# Patient Record
Sex: Female | Born: 1935
Health system: Southern US, Community
[De-identification: ages and names within clinical notes are randomized; demographics above are authoritative.]

## PROBLEM LIST (undated history)

## (undated) DIAGNOSIS — E782 Mixed hyperlipidemia: Secondary | ICD-10-CM

## (undated) DIAGNOSIS — I700 Atherosclerosis of aorta: Secondary | ICD-10-CM

## (undated) DIAGNOSIS — M810 Age-related osteoporosis without current pathological fracture: Secondary | ICD-10-CM

## (undated) DIAGNOSIS — K219 Gastro-esophageal reflux disease without esophagitis: Secondary | ICD-10-CM

## (undated) HISTORY — DX: Atherosclerosis of aorta: I70.0

## (undated) HISTORY — DX: Mixed hyperlipidemia: E78.2

## (undated) HISTORY — DX: Age-related osteoporosis without current pathological fracture: M81.0

## (undated) HISTORY — DX: Gastro-esophageal reflux disease without esophagitis: K21.9

---

## 2016-09-06 DIAGNOSIS — J209 Acute bronchitis, unspecified: Secondary | ICD-10-CM | POA: Diagnosis not present

## 2016-09-12 DIAGNOSIS — R05 Cough: Secondary | ICD-10-CM | POA: Diagnosis not present

## 2016-09-15 DIAGNOSIS — Z1389 Encounter for screening for other disorder: Secondary | ICD-10-CM | POA: Diagnosis not present

## 2016-09-15 DIAGNOSIS — R0789 Other chest pain: Secondary | ICD-10-CM | POA: Diagnosis not present

## 2016-09-15 DIAGNOSIS — Z9181 History of falling: Secondary | ICD-10-CM | POA: Diagnosis not present

## 2016-09-15 DIAGNOSIS — Z0000 Encounter for general adult medical examination without abnormal findings: Secondary | ICD-10-CM | POA: Diagnosis not present

## 2016-09-15 DIAGNOSIS — J40 Bronchitis, not specified as acute or chronic: Secondary | ICD-10-CM | POA: Diagnosis not present

## 2016-09-15 DIAGNOSIS — K219 Gastro-esophageal reflux disease without esophagitis: Secondary | ICD-10-CM | POA: Diagnosis not present

## 2016-09-16 DIAGNOSIS — R072 Precordial pain: Secondary | ICD-10-CM | POA: Diagnosis not present

## 2016-09-16 DIAGNOSIS — R079 Chest pain, unspecified: Secondary | ICD-10-CM | POA: Diagnosis not present

## 2016-09-19 DIAGNOSIS — R079 Chest pain, unspecified: Secondary | ICD-10-CM | POA: Insufficient documentation

## 2016-09-19 HISTORY — DX: Chest pain, unspecified: R07.9

## 2016-09-20 DIAGNOSIS — R079 Chest pain, unspecified: Secondary | ICD-10-CM | POA: Diagnosis not present

## 2016-10-03 DIAGNOSIS — R1011 Right upper quadrant pain: Secondary | ICD-10-CM | POA: Diagnosis not present

## 2016-10-03 DIAGNOSIS — R079 Chest pain, unspecified: Secondary | ICD-10-CM | POA: Diagnosis not present

## 2016-10-03 DIAGNOSIS — J479 Bronchiectasis, uncomplicated: Secondary | ICD-10-CM | POA: Diagnosis not present

## 2016-10-03 DIAGNOSIS — K8020 Calculus of gallbladder without cholecystitis without obstruction: Secondary | ICD-10-CM | POA: Diagnosis not present

## 2017-06-12 DIAGNOSIS — M5090 Cervical disc disorder, unspecified, unspecified cervical region: Secondary | ICD-10-CM | POA: Diagnosis not present

## 2017-07-05 DIAGNOSIS — Z23 Encounter for immunization: Secondary | ICD-10-CM | POA: Diagnosis not present

## 2017-08-01 DIAGNOSIS — H353131 Nonexudative age-related macular degeneration, bilateral, early dry stage: Secondary | ICD-10-CM | POA: Diagnosis not present

## 2017-08-01 DIAGNOSIS — H5202 Hypermetropia, left eye: Secondary | ICD-10-CM | POA: Diagnosis not present

## 2017-08-01 DIAGNOSIS — H40053 Ocular hypertension, bilateral: Secondary | ICD-10-CM | POA: Diagnosis not present

## 2017-09-22 DIAGNOSIS — M7062 Trochanteric bursitis, left hip: Secondary | ICD-10-CM | POA: Diagnosis not present

## 2017-09-22 DIAGNOSIS — M75100 Unspecified rotator cuff tear or rupture of unspecified shoulder, not specified as traumatic: Secondary | ICD-10-CM | POA: Diagnosis not present

## 2017-09-22 DIAGNOSIS — Z6822 Body mass index (BMI) 22.0-22.9, adult: Secondary | ICD-10-CM | POA: Diagnosis not present

## 2018-05-15 DIAGNOSIS — Z6823 Body mass index (BMI) 23.0-23.9, adult: Secondary | ICD-10-CM | POA: Diagnosis not present

## 2018-05-15 DIAGNOSIS — J320 Chronic maxillary sinusitis: Secondary | ICD-10-CM | POA: Diagnosis not present

## 2018-05-15 DIAGNOSIS — Z23 Encounter for immunization: Secondary | ICD-10-CM | POA: Diagnosis not present

## 2018-05-15 DIAGNOSIS — J310 Chronic rhinitis: Secondary | ICD-10-CM | POA: Diagnosis not present

## 2019-03-22 DIAGNOSIS — Z0000 Encounter for general adult medical examination without abnormal findings: Secondary | ICD-10-CM | POA: Diagnosis not present

## 2019-03-22 DIAGNOSIS — Z6822 Body mass index (BMI) 22.0-22.9, adult: Secondary | ICD-10-CM | POA: Diagnosis not present

## 2019-03-22 DIAGNOSIS — Z9181 History of falling: Secondary | ICD-10-CM | POA: Diagnosis not present

## 2019-03-22 DIAGNOSIS — K219 Gastro-esophageal reflux disease without esophagitis: Secondary | ICD-10-CM | POA: Diagnosis not present

## 2019-03-22 DIAGNOSIS — Z1331 Encounter for screening for depression: Secondary | ICD-10-CM | POA: Diagnosis not present

## 2019-03-22 DIAGNOSIS — M810 Age-related osteoporosis without current pathological fracture: Secondary | ICD-10-CM | POA: Diagnosis not present

## 2019-03-22 DIAGNOSIS — Z13820 Encounter for screening for osteoporosis: Secondary | ICD-10-CM | POA: Diagnosis not present

## 2019-03-22 DIAGNOSIS — Z79899 Other long term (current) drug therapy: Secondary | ICD-10-CM | POA: Diagnosis not present

## 2019-03-22 DIAGNOSIS — E782 Mixed hyperlipidemia: Secondary | ICD-10-CM | POA: Diagnosis not present

## 2019-05-20 DIAGNOSIS — Z6821 Body mass index (BMI) 21.0-21.9, adult: Secondary | ICD-10-CM | POA: Diagnosis not present

## 2019-05-20 DIAGNOSIS — H6983 Other specified disorders of Eustachian tube, bilateral: Secondary | ICD-10-CM | POA: Diagnosis not present

## 2019-05-20 DIAGNOSIS — J0100 Acute maxillary sinusitis, unspecified: Secondary | ICD-10-CM | POA: Diagnosis not present

## 2019-06-03 DIAGNOSIS — J0100 Acute maxillary sinusitis, unspecified: Secondary | ICD-10-CM | POA: Diagnosis not present

## 2019-06-03 DIAGNOSIS — H6591 Unspecified nonsuppurative otitis media, right ear: Secondary | ICD-10-CM | POA: Diagnosis not present

## 2019-06-03 DIAGNOSIS — J309 Allergic rhinitis, unspecified: Secondary | ICD-10-CM | POA: Diagnosis not present

## 2019-06-03 DIAGNOSIS — R0982 Postnasal drip: Secondary | ICD-10-CM | POA: Diagnosis not present

## 2019-06-03 DIAGNOSIS — Z6821 Body mass index (BMI) 21.0-21.9, adult: Secondary | ICD-10-CM | POA: Diagnosis not present

## 2019-06-14 DIAGNOSIS — H26493 Other secondary cataract, bilateral: Secondary | ICD-10-CM | POA: Diagnosis not present

## 2019-06-24 DIAGNOSIS — Z6820 Body mass index (BMI) 20.0-20.9, adult: Secondary | ICD-10-CM | POA: Diagnosis not present

## 2019-06-24 DIAGNOSIS — H6591 Unspecified nonsuppurative otitis media, right ear: Secondary | ICD-10-CM | POA: Diagnosis not present

## 2019-06-24 DIAGNOSIS — Z23 Encounter for immunization: Secondary | ICD-10-CM | POA: Diagnosis not present

## 2019-08-23 DIAGNOSIS — H26493 Other secondary cataract, bilateral: Secondary | ICD-10-CM | POA: Diagnosis not present

## 2020-01-09 DIAGNOSIS — C44729 Squamous cell carcinoma of skin of left lower limb, including hip: Secondary | ICD-10-CM | POA: Diagnosis not present

## 2020-01-23 DIAGNOSIS — C44722 Squamous cell carcinoma of skin of right lower limb, including hip: Secondary | ICD-10-CM | POA: Diagnosis not present

## 2020-03-26 DIAGNOSIS — Z0000 Encounter for general adult medical examination without abnormal findings: Secondary | ICD-10-CM | POA: Diagnosis not present

## 2020-03-26 DIAGNOSIS — Z9181 History of falling: Secondary | ICD-10-CM | POA: Diagnosis not present

## 2020-03-26 DIAGNOSIS — R739 Hyperglycemia, unspecified: Secondary | ICD-10-CM | POA: Diagnosis not present

## 2020-03-26 DIAGNOSIS — M810 Age-related osteoporosis without current pathological fracture: Secondary | ICD-10-CM | POA: Diagnosis not present

## 2020-03-26 DIAGNOSIS — E782 Mixed hyperlipidemia: Secondary | ICD-10-CM | POA: Diagnosis not present

## 2020-03-26 DIAGNOSIS — Z681 Body mass index (BMI) 19 or less, adult: Secondary | ICD-10-CM | POA: Diagnosis not present

## 2020-05-22 DIAGNOSIS — L299 Pruritus, unspecified: Secondary | ICD-10-CM | POA: Diagnosis not present

## 2020-05-22 DIAGNOSIS — L821 Other seborrheic keratosis: Secondary | ICD-10-CM | POA: Diagnosis not present

## 2020-05-22 DIAGNOSIS — C44729 Squamous cell carcinoma of skin of left lower limb, including hip: Secondary | ICD-10-CM | POA: Diagnosis not present

## 2021-03-16 ENCOUNTER — Emergency Department (HOSPITAL_COMMUNITY): Payer: PPO

## 2021-03-16 ENCOUNTER — Encounter (HOSPITAL_COMMUNITY): Payer: Self-pay | Admitting: *Deleted

## 2021-03-16 ENCOUNTER — Emergency Department (HOSPITAL_COMMUNITY): Admission: EM | Admit: 2021-03-16 | Payer: Self-pay | Source: Home / Self Care

## 2021-03-16 ENCOUNTER — Emergency Department (HOSPITAL_COMMUNITY)
Admission: EM | Admit: 2021-03-16 | Discharge: 2021-03-16 | Disposition: A | Discharge status: Home / Self Care | Payer: PPO | Attending: Emergency Medicine | Admitting: Emergency Medicine

## 2021-03-16 DIAGNOSIS — R079 Chest pain, unspecified: Secondary | ICD-10-CM

## 2021-03-16 DIAGNOSIS — R072 Precordial pain: Secondary | ICD-10-CM | POA: Insufficient documentation

## 2021-03-16 DIAGNOSIS — R0789 Other chest pain: Secondary | ICD-10-CM | POA: Diagnosis not present

## 2021-03-16 DIAGNOSIS — R531 Weakness: Secondary | ICD-10-CM | POA: Diagnosis not present

## 2021-03-16 DIAGNOSIS — R1013 Epigastric pain: Secondary | ICD-10-CM | POA: Diagnosis not present

## 2021-03-16 LAB — CBC WITH DIFFERENTIAL/PLATELET
Abs Immature Granulocytes: 0.02 10*3/uL (ref 0.00–0.07)
Basophils Absolute: 0 10*3/uL (ref 0.0–0.1)
Basophils Relative: 1 %
Eosinophils Absolute: 0.1 10*3/uL (ref 0.0–0.5)
Eosinophils Relative: 1 %
HCT: 39.7 % (ref 36.0–46.0)
Hemoglobin: 13 g/dL (ref 12.0–15.0)
Immature Granulocytes: 0 %
Lymphocytes Relative: 33 %
Lymphs Abs: 2.2 10*3/uL (ref 0.7–4.0)
MCH: 29.9 pg (ref 26.0–34.0)
MCHC: 32.7 g/dL (ref 30.0–36.0)
MCV: 91.3 fL (ref 80.0–100.0)
Monocytes Absolute: 0.5 10*3/uL (ref 0.1–1.0)
Monocytes Relative: 7 %
Neutro Abs: 3.8 10*3/uL (ref 1.7–7.7)
Neutrophils Relative %: 58 %
Platelets: 261 10*3/uL (ref 150–400)
RBC: 4.35 MIL/uL (ref 3.87–5.11)
RDW: 13.8 % (ref 11.5–15.5)
WBC: 6.6 10*3/uL (ref 4.0–10.5)
nRBC: 0 % (ref 0.0–0.2)

## 2021-03-16 LAB — COMPREHENSIVE METABOLIC PANEL
ALT: 16 U/L (ref 0–44)
AST: 22 U/L (ref 15–41)
Albumin: 4 g/dL (ref 3.5–5.0)
Alkaline Phosphatase: 64 U/L (ref 38–126)
Anion gap: 7 (ref 5–15)
BUN: 9 mg/dL (ref 8–23)
CO2: 28 mmol/L (ref 22–32)
Calcium: 9.4 mg/dL (ref 8.9–10.3)
Chloride: 102 mmol/L (ref 98–111)
Creatinine, Ser: 0.75 mg/dL (ref 0.44–1.00)
GFR, Estimated: >60 mL/min (ref >60)
Glucose, Bld: 92 mg/dL (ref 70–99)
Potassium: 4 mmol/L (ref 3.5–5.1)
Sodium: 137 mmol/L (ref 135–145)
Total Bilirubin: 0.8 mg/dL (ref 0.3–1.2)
Total Protein: 6.1 g/dL — ABNORMAL LOW (ref 6.5–8.1)

## 2021-03-16 LAB — TROPONIN I (HIGH SENSITIVITY)
Troponin I (High Sensitivity): 3 ng/L (ref <18)
Troponin I (High Sensitivity): 4 ng/L (ref <18)

## 2021-03-16 NOTE — Discharge Instructions (Addendum)
Please call your family doctor and discuss with them your visit today.  Your troponins were both negative which makes it unlikely that you are having an acute heart attack but not impossible.  Please return for worsening symptoms recurrent and persistent chest pain.  Based on your age and history your family doctor may decide that you need to see a cardiologist or perhaps have a different study to evaluate your heart.

## 2021-03-16 NOTE — ED Notes (Signed)
Unable to locate pt in lobby for vital check.

## 2021-03-16 NOTE — ED Triage Notes (Signed)
To ED for eval after having episode of epigastric pain. Pt states she was working in her yard when the pain started and lasted a few minutes. EMS came to home and did an ECG. Pt brought to ED via POV. No pain now. States 'i don't feel right'. Pt tells me she took a 'bunch' of tums and ems gave her asa pta. No n/v. Appears nad.

## 2021-03-16 NOTE — ED Provider Notes (Signed)
Emergency Medicine Provider Triage Evaluation Note  BANI GIANFRANCESCO , a 87 y.o. female  was evaluated in triage.  Pt complains of chest pain that started today while she was applying Roundup on the yard, states the pain stayed around her mid chest, does not radiate, did not have any associated shortness of breath, becoming diaphoretic, nausea, vomiting, lightheaded or dizziness.  She has no some cardiac history, denies any illicit drug use.  She currently has no chest pain this time..  Review of Systems  Positive: Chest pain Negative: Shortness of breath pedal edema  Physical Exam  BP (!) 172/80   Pulse 74   Temp 98.2 F (36.8 C) (Oral)   Resp 12   SpO2 100%  Gen:   Awake, no distress   Resp:  Normal effort  MSK:   Moves extremities without difficulty  Other:    Medical Decision Making  Medically screening exam initiated at 12:26 PM.  Appropriate orders placed.  Tiana Loft was informed that the remainder of the evaluation will be completed by another provider, this initial triage assessment does not replace that evaluation, and the importance of remaining in the ED until their evaluation is complete.  Patient presents with chest pain, labwork imaging have been ordered, patient will need further work-up in the emergency department.   Marcello Fennel, PA-C 03/16/21 1227    Carmin Muskrat, MD 03/16/21 [phone]

## 2021-03-17 ENCOUNTER — Encounter (HOSPITAL_COMMUNITY): Payer: Self-pay | Admitting: *Deleted

## 2021-03-24 DIAGNOSIS — I200 Unstable angina: Secondary | ICD-10-CM | POA: Diagnosis not present

## 2021-03-24 DIAGNOSIS — Z681 Body mass index (BMI) 19 or less, adult: Secondary | ICD-10-CM | POA: Diagnosis not present

## 2021-03-24 DIAGNOSIS — I700 Atherosclerosis of aorta: Secondary | ICD-10-CM | POA: Diagnosis not present

## 2021-03-25 DIAGNOSIS — I200 Unstable angina: Secondary | ICD-10-CM | POA: Diagnosis not present

## 2021-03-25 DIAGNOSIS — R079 Chest pain, unspecified: Secondary | ICD-10-CM | POA: Diagnosis not present

## 2021-03-25 DIAGNOSIS — I700 Atherosclerosis of aorta: Secondary | ICD-10-CM | POA: Diagnosis not present

## 2021-03-31 NOTE — ED Provider Notes (Signed)
[facility] EMERGENCY DEPARTMENT Provider Note   CSN: [id number] Arrival date & time: 03/16/21  1159     History Chief Complaint  Patient presents with   Chest Pain    Jennifer Banks is a 87 y.o. female.  87 yo F with a chief complaints of chest pain.  Started while she was spraying Roundup in her yard.  Lasted for a few hours and then resolved.  Currently has no pain has not had pain for some time.  Denies any shortness of breath diaphoresis nausea or vomiting with this.  The history is provided by the patient.  Chest Pain Pain location:  Substernal area Pain quality: aching   Pain radiates to:  Does not radiate Pain severity:  Mild Onset quality:  Sudden Duration:  2 minutes Timing:  Rare Progression:  Resolved Chronicity:  New Relieved by:  Nothing Worsened by:  Nothing Ineffective treatments:  None tried Associated symptoms: no dizziness, no fever, no headache, no nausea, no palpitations, no shortness of breath and no vomiting       History reviewed. No pertinent past medical history.  There are no problems to display for this patient.   History reviewed. No pertinent surgical history.   OB History   No obstetric history on file.     No family history on file.     Home Medications Prior to Admission medications   Not on File    Allergies    Codeine, Morphine and related, and Penicillins  Review of Systems   Review of Systems  Constitutional:  Negative for chills and fever.  HENT:  Negative for congestion and rhinorrhea.   Eyes:  Negative for redness and visual disturbance.  Respiratory:  Negative for shortness of breath and wheezing.   Cardiovascular:  Positive for chest pain. Negative for palpitations.  Gastrointestinal:  Negative for nausea and vomiting.  Genitourinary:  Negative for dysuria and urgency.  Musculoskeletal:  Negative for arthralgias and myalgias.  Skin:  Negative for pallor and wound.  Neurological:   Negative for dizziness and headaches.   Physical Exam Updated Vital Signs BP 138/69   Pulse 67   Temp 97.6 F (36.4 C) (Oral)   Resp 15   SpO2 100%   Physical Exam Vitals and nursing note reviewed.  Constitutional:      General: She is not in acute distress.    Appearance: She is well-developed. She is not diaphoretic.  HENT:     Head: Normocephalic and atraumatic.  Eyes:     Pupils: Pupils are equal, round, and reactive to light.  Cardiovascular:     Rate and Rhythm: Normal rate and regular rhythm.     Heart sounds: No murmur heard.   No friction rub. No gallop.  Pulmonary:     Effort: Pulmonary effort is normal.     Breath sounds: No wheezing or rales.  Abdominal:     General: There is no distension.     Palpations: Abdomen is soft.     Tenderness: There is no abdominal tenderness.  Musculoskeletal:        General: No tenderness.     Cervical back: Normal range of motion and neck supple.  Skin:    General: Skin is warm and dry.  Neurological:     Mental Status: She is alert and oriented to person, place, and time.  Psychiatric:        Behavior: Behavior normal.    ED Results / Procedures / Treatments  Labs (all labs ordered are listed, but only abnormal results are displayed) Labs Reviewed  COMPREHENSIVE METABOLIC PANEL - Abnormal; Notable for the following components:      Result Value   Total Protein 6.1 (*)    All other components within normal limits  CBC WITH DIFFERENTIAL/PLATELET  TROPONIN I (HIGH SENSITIVITY)  TROPONIN I (HIGH SENSITIVITY)    EKG EKG Interpretation  Date/Time:  Tuesday March 16 2021 12:15:56 EDT Ventricular Rate:  77 PR Interval:  154 QRS Duration: 72 QT Interval:  382 QTC Calculation: 432 R Axis:   36 Text Interpretation: Normal sinus rhythm Nonspecific T wave abnormality Abnormal ECG No old tracing to compare Confirmed by Deno Etienne [phone]) on 03/16/2021 5:54:14 PM  Radiology No results found.  Procedures Procedures    Medications Ordered in ED Medications - No data to display  ED Course  I have reviewed the triage vital signs and the nursing notes.  Pertinent labs & imaging results that were available during my care of the patient were reviewed by me and considered in my medical decision making (see chart for details).    MDM Rules/Calculators/A&P                           87 yo F with a chief complaint of chest pain.  Nonspecific presentation EKG without concerning finding chest x-ray viewed by me without focal infiltrate.  Delta trop both negative.  Discharge home.  PCP follow-up.  11:00 PM:  I have discussed the diagnosis/risks/treatment options with the patient and believe the pt to be eligible for discharge home to follow-up with PCP. We also discussed returning to the ED immediately if new or worsening sx occur. We discussed the sx which are most concerning (e.g., sudden worsening pain, fever, inability to tolerate by mouth) that necessitate immediate return. Medications administered to the patient during their visit and any new prescriptions provided to the patient are listed below.  Medications given during this visit Medications - No data to display   The patient appears reasonably screen and/or stabilized for discharge and I doubt any other medical condition or other [facility] requiring further screening, evaluation, or treatment in the ED at this time prior to discharge.   Final Clinical Impression(s) / ED Diagnoses Final diagnoses:  Nonspecific chest pain    Rx / DC Orders ED Discharge Orders     None        Deno Etienne, DO 03/31/21 2300

## 2021-04-01 ENCOUNTER — Encounter: Payer: Self-pay | Admitting: *Deleted

## 2021-04-01 ENCOUNTER — Encounter: Payer: Self-pay | Admitting: Cardiology

## 2021-04-05 DIAGNOSIS — Z681 Body mass index (BMI) 19 or less, adult: Secondary | ICD-10-CM | POA: Diagnosis not present

## 2021-04-05 DIAGNOSIS — J449 Chronic obstructive pulmonary disease, unspecified: Secondary | ICD-10-CM | POA: Diagnosis not present

## 2021-04-05 DIAGNOSIS — Z1331 Encounter for screening for depression: Secondary | ICD-10-CM | POA: Diagnosis not present

## 2021-04-05 DIAGNOSIS — J301 Allergic rhinitis due to pollen: Secondary | ICD-10-CM | POA: Diagnosis not present

## 2021-04-05 DIAGNOSIS — Z79899 Other long term (current) drug therapy: Secondary | ICD-10-CM | POA: Diagnosis not present

## 2021-04-05 DIAGNOSIS — M810 Age-related osteoporosis without current pathological fracture: Secondary | ICD-10-CM | POA: Diagnosis not present

## 2021-04-05 DIAGNOSIS — Z0000 Encounter for general adult medical examination without abnormal findings: Secondary | ICD-10-CM | POA: Diagnosis not present

## 2021-04-05 DIAGNOSIS — I700 Atherosclerosis of aorta: Secondary | ICD-10-CM | POA: Diagnosis not present

## 2021-04-05 DIAGNOSIS — E782 Mixed hyperlipidemia: Secondary | ICD-10-CM | POA: Diagnosis not present

## 2021-04-05 DIAGNOSIS — Z9181 History of falling: Secondary | ICD-10-CM | POA: Diagnosis not present

## 2021-04-05 DIAGNOSIS — K2960 Other gastritis without bleeding: Secondary | ICD-10-CM | POA: Diagnosis not present

## 2021-04-05 DIAGNOSIS — I200 Unstable angina: Secondary | ICD-10-CM | POA: Diagnosis not present

## 2021-04-07 ENCOUNTER — Other Ambulatory Visit: Payer: Self-pay

## 2021-04-07 ENCOUNTER — Ambulatory Visit: Payer: PPO | Admitting: Student

## 2021-04-07 ENCOUNTER — Encounter: Payer: Self-pay | Admitting: Student

## 2021-04-07 VITALS — BP 122/68 | HR 78 | Ht 62.0 in | Wt 108.0 lb

## 2021-04-07 DIAGNOSIS — R0789 Other chest pain: Secondary | ICD-10-CM

## 2021-04-07 DIAGNOSIS — R052 Subacute cough: Secondary | ICD-10-CM | POA: Diagnosis not present

## 2021-04-07 DIAGNOSIS — R918 Other nonspecific abnormal finding of lung field: Secondary | ICD-10-CM

## 2021-04-07 NOTE — Patient Instructions (Signed)
-   Can look on amazon for 'flutter valve' - they are $35-40. I have also prescribed it so you can try picking it up at the pharmacy. Twice daily take 10 slow but firm breaths through it to try to cough up some junk. - I have ordered a swallowing test which our front desk will help schedule - I will see you in 4 months after your cardiology appointment - If this isn't simply related to trouble swallowing/aspiration then another consideration is a slow nontoxic infection from non-tuberculous mycobacteria. If your cough and shortness of breath haven't improved, or if you're having fever, night sweats and your heart looks ok to the cardiologist then we can discuss the role of bronchoscopy.
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
## 2021-04-08 ENCOUNTER — Other Ambulatory Visit (HOSPITAL_COMMUNITY): Payer: Self-pay

## 2021-04-08 DIAGNOSIS — R1310 Dysphagia, unspecified: Secondary | ICD-10-CM

## 2021-04-12 ENCOUNTER — Other Ambulatory Visit: Payer: Self-pay

## 2021-04-12 ENCOUNTER — Ambulatory Visit (HOSPITAL_COMMUNITY)
Admission: RE | Admit: 2021-04-12 | Discharge: 2021-04-12 | Disposition: A | Discharge status: Home / Self Care | Payer: PPO | Source: Ambulatory Visit | Attending: Student | Admitting: Student

## 2021-04-12 DIAGNOSIS — R1310 Dysphagia, unspecified: Secondary | ICD-10-CM | POA: Insufficient documentation

## 2021-04-12 DIAGNOSIS — R918 Other nonspecific abnormal finding of lung field: Secondary | ICD-10-CM

## 2021-04-12 DIAGNOSIS — R0989 Other specified symptoms and signs involving the circulatory and respiratory systems: Secondary | ICD-10-CM | POA: Diagnosis not present

## 2021-04-12 NOTE — Progress Notes (Signed)
Modified Barium Swallow Progress Note  Patient Details  Name: Jennifer Banks MRN: [id number] Date of Birth: August 17, 1933  Today's Date: 04/12/2021  Modified Barium Swallow completed.  Full report located under Chart Review in the Imaging Section.  Brief recommendations include the following:  Clinical Impression  Pt has a functional oropharyngeal swallow with no aspiration observed. She did have a few isolated instances of penetration, but this cleared spontaneously (primarily PAS #2, PAS #4 x1) and is not considered to be abnormal. Although there were no findings that could directly contribute to her coughing, SLP did review aspiration and esophageal precautions. Would defer additional w/u at this point to MD.   Swallow Evaluation Recommendations   Recommended Consults: Consider ENT evaluation;Consider esophageal assessment   SLP Diet Recommendations: Regular solids;Thin liquid   Liquid Administration via: Cup;Straw   Medication Administration: Whole meds with liquid   Supervision: Patient able to self feed   Compensations: Slow rate;Small sips/bites;Follow solids with liquid   Postural Changes: Remain semi-upright after after feeds/meals (Comment);Seated upright at 90 degrees   Oral Care Recommendations: Oral care BID        Osie Bond., M.A. [facility] Pager [phone] Office [phone]  04/12/2021,12:42 PM

## 2021-04-13 ENCOUNTER — Telehealth: Payer: Self-pay | Admitting: Student

## 2021-04-13 NOTE — Telephone Encounter (Signed)
Called to discuss results of swallow study which was normal. Will await cardiology clinic evaluation prior to next visit at which point we will consider bronchoscopy/BAL for possible NTM infection. All questions addressed.

## 2021-04-29 ENCOUNTER — Ambulatory Visit: Payer: PPO | Admitting: Cardiology

## 2021-05-03 ENCOUNTER — Ambulatory Visit: Payer: PPO | Admitting: Cardiology

## 2021-06-11 ENCOUNTER — Ambulatory Visit: Payer: PPO | Admitting: Cardiology

## 2021-07-15 DIAGNOSIS — Z23 Encounter for immunization: Secondary | ICD-10-CM | POA: Diagnosis not present

## 2021-08-11 DIAGNOSIS — Z681 Body mass index (BMI) 19 or less, adult: Secondary | ICD-10-CM | POA: Diagnosis not present

## 2021-08-11 DIAGNOSIS — N3001 Acute cystitis with hematuria: Secondary | ICD-10-CM | POA: Diagnosis not present

## 2021-08-17 DIAGNOSIS — R3129 Other microscopic hematuria: Secondary | ICD-10-CM | POA: Diagnosis not present

## 2021-08-17 DIAGNOSIS — N3946 Mixed incontinence: Secondary | ICD-10-CM | POA: Diagnosis not present

## 2021-08-17 DIAGNOSIS — N3000 Acute cystitis without hematuria: Secondary | ICD-10-CM | POA: Diagnosis not present

## 2021-08-17 DIAGNOSIS — Z681 Body mass index (BMI) 19 or less, adult: Secondary | ICD-10-CM | POA: Diagnosis not present

## 2021-08-18 IMAGING — RF DG SWALLOWING FUNCTION
12 series · 12 of 24 positions shown · non-contrast
Comparison: Chest CTA 03/25/2021

CLINICAL DATA: Dysphagia. Foreign body sensation in throat.

EXAM:
MODIFIED BARIUM SWALLOW
TECHNIQUE: Different consistencies of barium were administered orally to the
patient by the Speech Pathologist. Imaging of the pharynx was
performed in the lateral projection. The radiologist was present in
the fluoroscopy room for this study, providing personal supervision.
FLUOROSCOPY TIME:  Fluoroscopy Time:  52 seconds
Radiation Exposure Index (if provided by the fluoroscopic device):
NA
Number of Acquired Spot Images: 0

[Series 1: run · 1 of 13 frames shown (1 of 12)]
[frame 7/13]
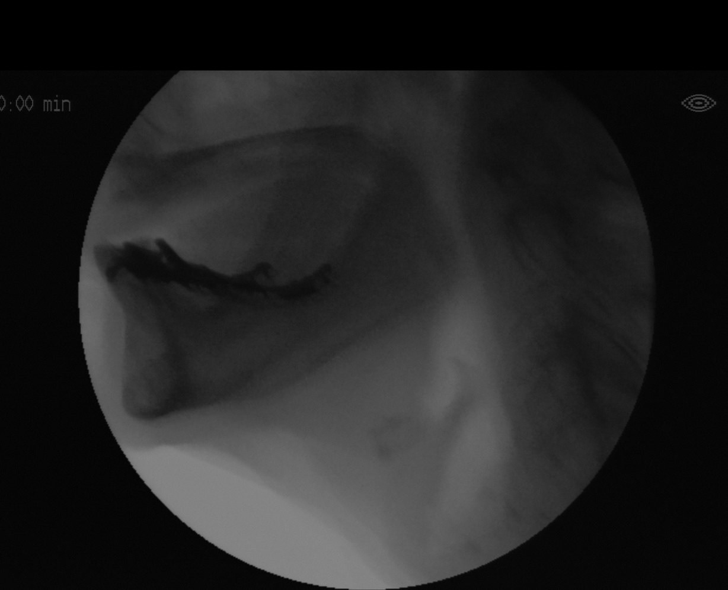

[Series 2: run · 1 of 10 frames shown (2 of 12)]
[frame 9/10]
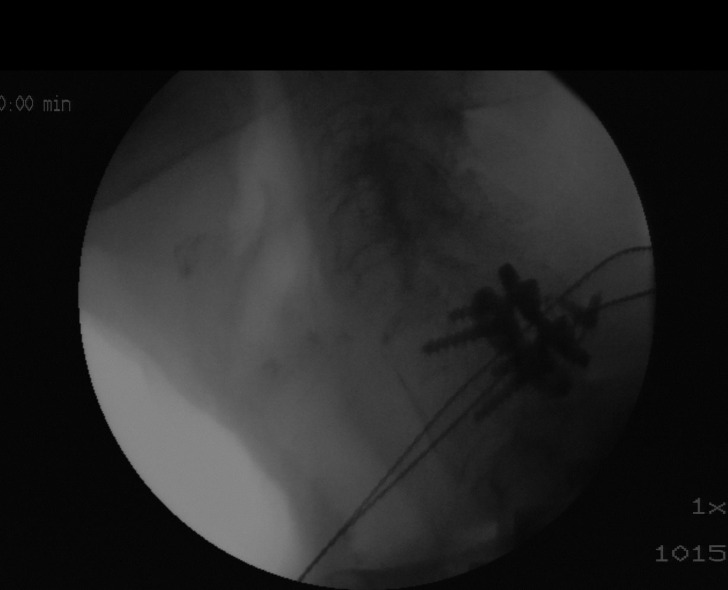

[Series 3: run · 1 of 179 frames shown (3 of 12)]
[frame 90/179]
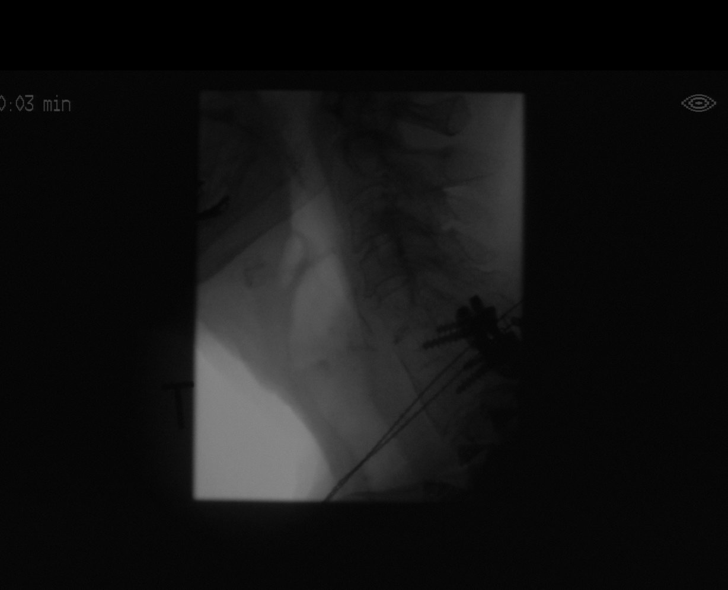

[Series 4: run · 1 of 185 frames shown (4 of 12)]
[frame 116/185]
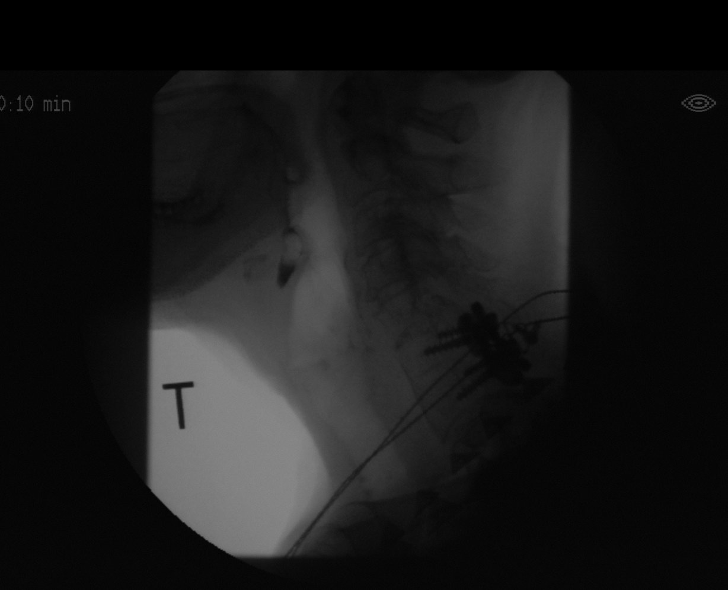

[Series 5: run · 1 of 172 frames shown (5 of 12)]
[frame 130/172]
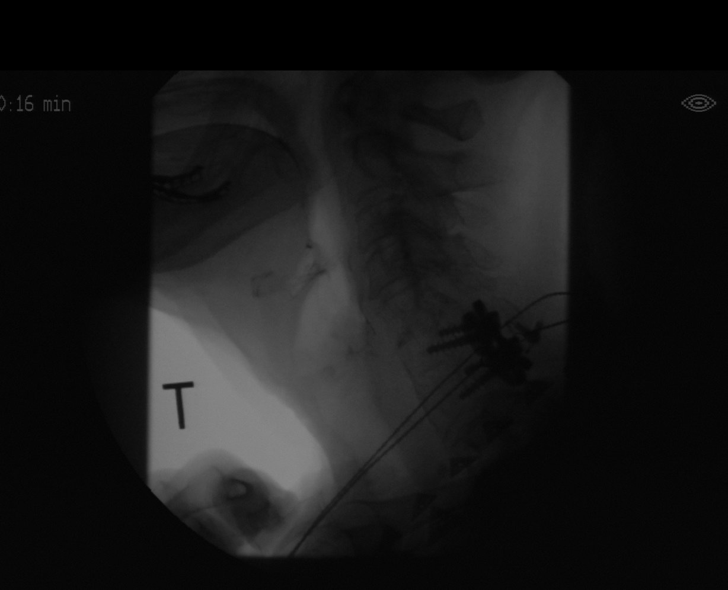

[Series 6: run · 1 of 77 frames shown (6 of 12)]
[frame 48/77]
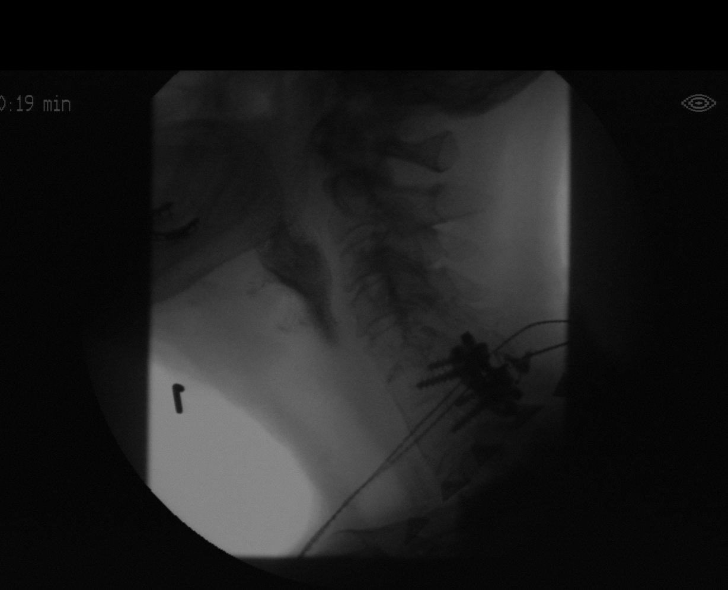

[Series 7: run · 1 of 53 frames shown (7 of 12)]
[frame 50/53]
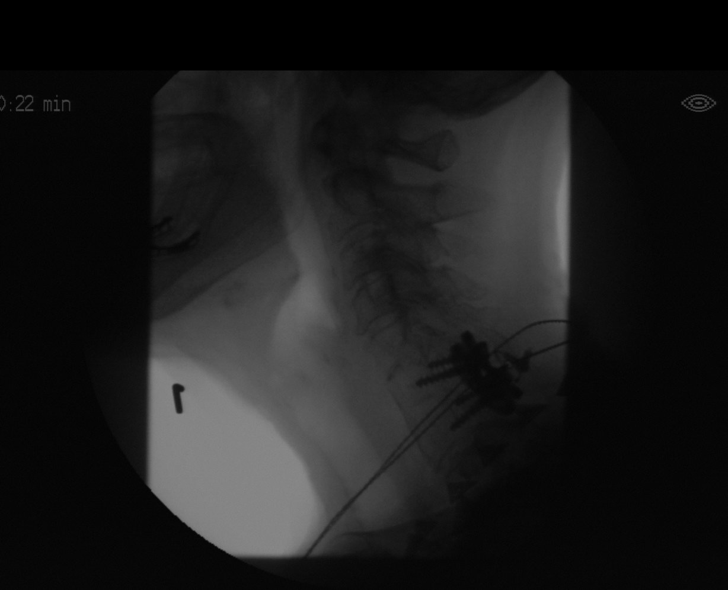

[Series 8: run · 1 of 38 frames shown (8 of 12)]
[frame 37/38]
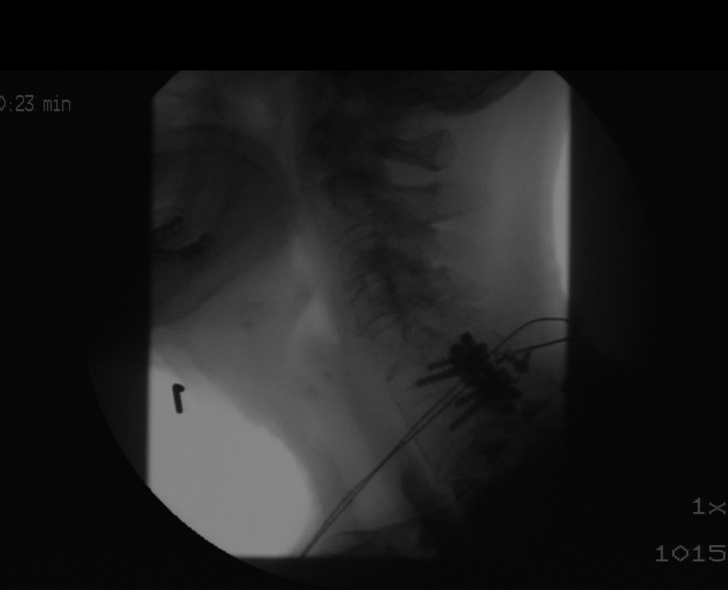

[Series 9: run · 1 of 157 frames shown (9 of 12)]
[frame 134/157]
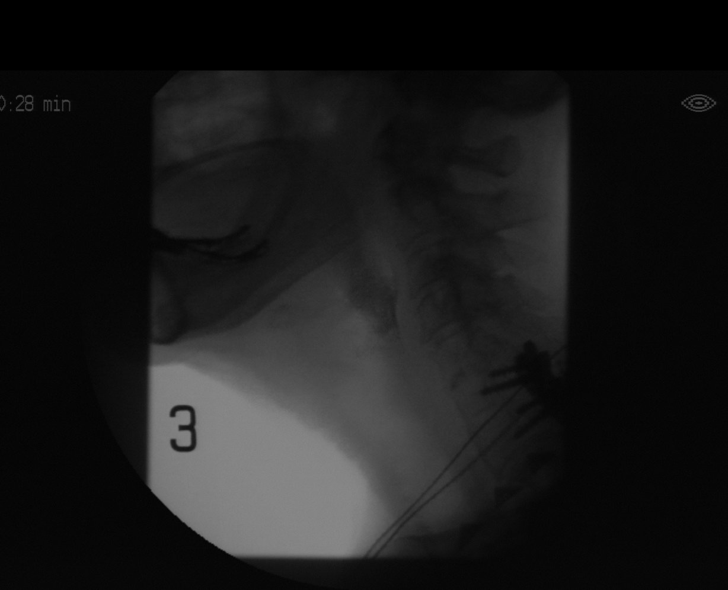

[Series 10: run · 1 of 143 frames shown (10 of 12)]
[frame 122/143]
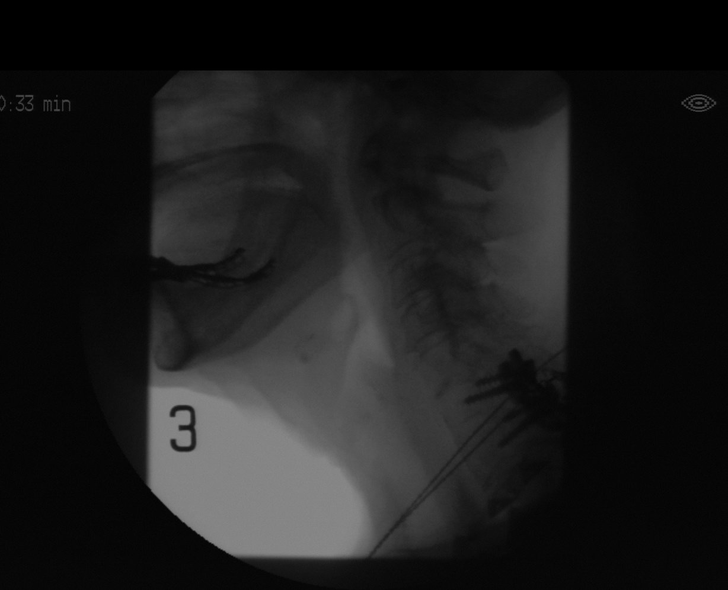

[Series 11: run · 1 of 251 frames shown (11 of 12)]
[frame 249/251]
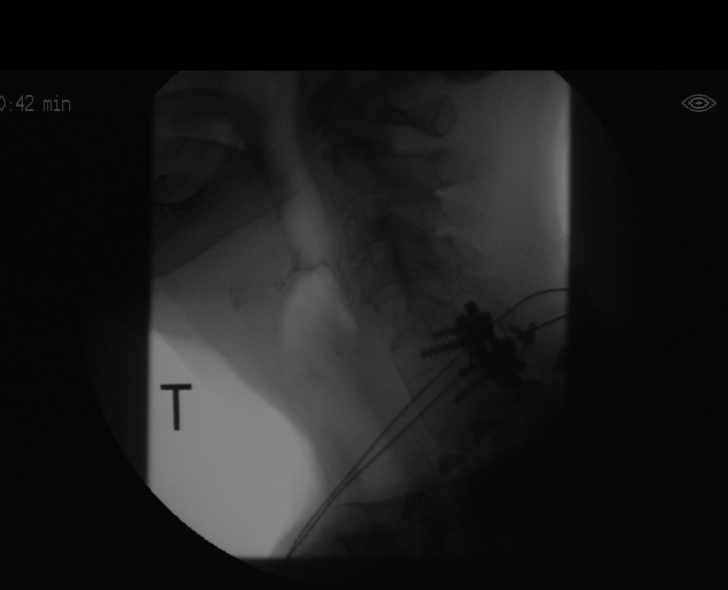

[Series 12: run · 1 of 313 frames shown (12 of 12)]
[frame 267/313]
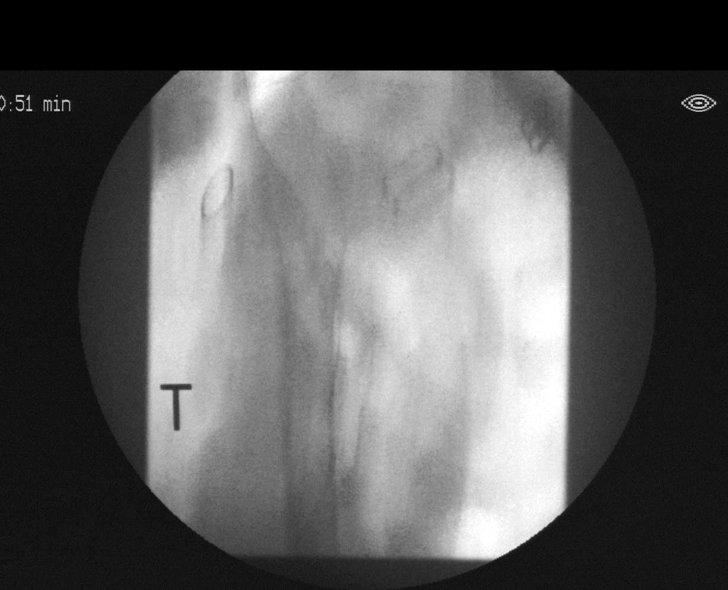

[12 of 24 positions shown; findings below may reference images not displayed]

FINDINGS: The swallowing function appeared within normal limits. No laryngeal
penetration or aspiration identified. No evidence of foreign body.
Postsurgical changes are noted in the lower cervical spine.
IMPRESSION: Swallowing function within normal limits. No laryngeal penetration
or aspiration.

Please refer to the Speech Pathologists report for complete details
and recommendations.

## 2022-12-15 DIAGNOSIS — R636 Underweight: Secondary | ICD-10-CM | POA: Diagnosis not present

## 2022-12-15 DIAGNOSIS — Z681 Body mass index (BMI) 19 or less, adult: Secondary | ICD-10-CM | POA: Diagnosis not present

## 2022-12-15 DIAGNOSIS — Y708 Miscellaneous anesthesiology devices associated with adverse incidents, not elsewhere classified: Secondary | ICD-10-CM | POA: Diagnosis not present

## 2022-12-15 DIAGNOSIS — F4321 Adjustment disorder with depressed mood: Secondary | ICD-10-CM | POA: Diagnosis not present

## 2022-12-15 DIAGNOSIS — I25119 Atherosclerotic heart disease of native coronary artery with unspecified angina pectoris: Secondary | ICD-10-CM | POA: Diagnosis not present

## 2022-12-15 DIAGNOSIS — I700 Atherosclerosis of aorta: Secondary | ICD-10-CM | POA: Diagnosis not present

## 2022-12-15 DIAGNOSIS — F5000 Anorexia nervosa, unspecified: Secondary | ICD-10-CM | POA: Diagnosis not present

## 2023-02-23 DIAGNOSIS — F4321 Adjustment disorder with depressed mood: Secondary | ICD-10-CM | POA: Diagnosis not present

## 2023-02-23 DIAGNOSIS — R636 Underweight: Secondary | ICD-10-CM | POA: Diagnosis not present

## 2023-02-23 DIAGNOSIS — Z681 Body mass index (BMI) 19 or less, adult: Secondary | ICD-10-CM | POA: Diagnosis not present

## 2023-02-23 DIAGNOSIS — F5000 Anorexia nervosa, unspecified: Secondary | ICD-10-CM | POA: Diagnosis not present

## 2023-02-23 DIAGNOSIS — S51812A Laceration without foreign body of left forearm, initial encounter: Secondary | ICD-10-CM | POA: Diagnosis not present

## 2023-03-02 DIAGNOSIS — E782 Mixed hyperlipidemia: Secondary | ICD-10-CM | POA: Diagnosis not present

## 2023-03-02 DIAGNOSIS — H9193 Unspecified hearing loss, bilateral: Secondary | ICD-10-CM | POA: Diagnosis not present

## 2023-03-02 DIAGNOSIS — J449 Chronic obstructive pulmonary disease, unspecified: Secondary | ICD-10-CM | POA: Diagnosis not present

## 2023-03-02 DIAGNOSIS — F419 Anxiety disorder, unspecified: Secondary | ICD-10-CM | POA: Diagnosis not present

## 2023-03-02 DIAGNOSIS — I700 Atherosclerosis of aorta: Secondary | ICD-10-CM | POA: Diagnosis not present

## 2023-03-02 DIAGNOSIS — F330 Major depressive disorder, recurrent, mild: Secondary | ICD-10-CM | POA: Diagnosis not present

## 2023-03-02 DIAGNOSIS — F5000 Anorexia nervosa, unspecified: Secondary | ICD-10-CM | POA: Diagnosis not present

## 2023-03-02 DIAGNOSIS — Z9849 Cataract extraction status, unspecified eye: Secondary | ICD-10-CM | POA: Diagnosis not present

## 2023-05-10 DIAGNOSIS — E782 Mixed hyperlipidemia: Secondary | ICD-10-CM | POA: Diagnosis not present

## 2023-05-10 DIAGNOSIS — Y748 Miscellaneous general hospital and personal-use devices associated with adverse incidents, not elsewhere classified: Secondary | ICD-10-CM | POA: Diagnosis not present

## 2023-05-10 DIAGNOSIS — G720 Drug-induced myopathy: Secondary | ICD-10-CM | POA: Diagnosis not present

## 2023-05-10 DIAGNOSIS — T466X5A Adverse effect of antihyperlipidemic and antiarteriosclerotic drugs, initial encounter: Secondary | ICD-10-CM | POA: Diagnosis not present

## 2023-05-10 DIAGNOSIS — M810 Age-related osteoporosis without current pathological fracture: Secondary | ICD-10-CM | POA: Diagnosis not present

## 2023-05-10 DIAGNOSIS — K2960 Other gastritis without bleeding: Secondary | ICD-10-CM | POA: Diagnosis not present

## 2023-05-10 DIAGNOSIS — Z0000 Encounter for general adult medical examination without abnormal findings: Secondary | ICD-10-CM | POA: Diagnosis not present

## 2023-05-10 DIAGNOSIS — I700 Atherosclerosis of aorta: Secondary | ICD-10-CM | POA: Diagnosis not present

## 2023-05-10 DIAGNOSIS — F5000 Anorexia nervosa, unspecified: Secondary | ICD-10-CM | POA: Diagnosis not present

## 2023-05-10 DIAGNOSIS — Z79899 Other long term (current) drug therapy: Secondary | ICD-10-CM | POA: Diagnosis not present

## 2023-05-10 DIAGNOSIS — I25119 Atherosclerotic heart disease of native coronary artery with unspecified angina pectoris: Secondary | ICD-10-CM | POA: Diagnosis not present

## 2023-05-10 DIAGNOSIS — J301 Allergic rhinitis due to pollen: Secondary | ICD-10-CM | POA: Diagnosis not present

## 2024-06-11 DIAGNOSIS — Z79899 Other long term (current) drug therapy: Secondary | ICD-10-CM | POA: Diagnosis not present

## 2024-06-11 DIAGNOSIS — G720 Drug-induced myopathy: Secondary | ICD-10-CM | POA: Diagnosis not present

## 2024-06-11 DIAGNOSIS — Z1331 Encounter for screening for depression: Secondary | ICD-10-CM | POA: Diagnosis not present

## 2024-06-11 DIAGNOSIS — J301 Allergic rhinitis due to pollen: Secondary | ICD-10-CM | POA: Diagnosis not present

## 2024-06-11 DIAGNOSIS — M810 Age-related osteoporosis without current pathological fracture: Secondary | ICD-10-CM | POA: Diagnosis not present

## 2024-06-11 DIAGNOSIS — Z1339 Encounter for screening examination for other mental health and behavioral disorders: Secondary | ICD-10-CM | POA: Diagnosis not present

## 2024-06-11 DIAGNOSIS — Z9181 History of falling: Secondary | ICD-10-CM | POA: Diagnosis not present

## 2024-06-11 DIAGNOSIS — Z23 Encounter for immunization: Secondary | ICD-10-CM | POA: Diagnosis not present

## 2024-06-11 DIAGNOSIS — E782 Mixed hyperlipidemia: Secondary | ICD-10-CM | POA: Diagnosis not present

## 2024-06-11 DIAGNOSIS — Z0000 Encounter for general adult medical examination without abnormal findings: Secondary | ICD-10-CM | POA: Diagnosis not present

## 2024-06-11 DIAGNOSIS — Z131 Encounter for screening for diabetes mellitus: Secondary | ICD-10-CM | POA: Diagnosis not present

## 2024-06-11 DIAGNOSIS — F5000 Anorexia nervosa, unspecified: Secondary | ICD-10-CM | POA: Diagnosis not present

## 2024-06-13 DIAGNOSIS — R519 Headache, unspecified: Secondary | ICD-10-CM | POA: Diagnosis not present

## 2024-06-13 DIAGNOSIS — H9319 Tinnitus, unspecified ear: Secondary | ICD-10-CM | POA: Diagnosis not present

## 2024-06-13 DIAGNOSIS — R42 Dizziness and giddiness: Secondary | ICD-10-CM | POA: Diagnosis not present

## 2024-06-13 DIAGNOSIS — J343 Hypertrophy of nasal turbinates: Secondary | ICD-10-CM | POA: Diagnosis not present

## 2024-06-13 DIAGNOSIS — H9190 Unspecified hearing loss, unspecified ear: Secondary | ICD-10-CM | POA: Diagnosis not present

## 2024-06-13 DIAGNOSIS — R0981 Nasal congestion: Secondary | ICD-10-CM | POA: Diagnosis not present

## 2024-06-18 DIAGNOSIS — H903 Sensorineural hearing loss, bilateral: Secondary | ICD-10-CM | POA: Diagnosis not present

## 2024-07-15 DIAGNOSIS — H903 Sensorineural hearing loss, bilateral: Secondary | ICD-10-CM | POA: Diagnosis not present
